# Patient Record
Sex: Female | Born: 2005 | Race: White | Hispanic: No | Marital: Single | Smoking: Never smoker
Health system: Southern US, Community
[De-identification: ages and names within clinical notes are randomized; demographics above are authoritative.]

---
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
## 2023-11-11 ENCOUNTER — Other Ambulatory Visit: Payer: Self-pay | Admitting: Nurse Practitioner

## 2023-11-11 DIAGNOSIS — N644 Mastodynia: Secondary | ICD-10-CM

## 2023-11-24 ENCOUNTER — Ambulatory Visit
Admission: RE | Admit: 2023-11-24 | Discharge: 2023-11-24 | Disposition: A | Discharge status: Home / Self Care | Payer: 59 | Source: Ambulatory Visit | Attending: Nurse Practitioner | Admitting: Nurse Practitioner

## 2023-11-24 DIAGNOSIS — N644 Mastodynia: Secondary | ICD-10-CM
